# Patient Record
Sex: Male | Born: 1955 | Race: Black or African American | Hispanic: No | Marital: Single | Smoking: Current every day smoker
Health system: Southern US, Community
[De-identification: ages and names within clinical notes are randomized; demographics above are authoritative.]

## PROBLEM LIST (undated history)

## (undated) DIAGNOSIS — I10 Essential (primary) hypertension: Secondary | ICD-10-CM

## (undated) DIAGNOSIS — E119 Type 2 diabetes mellitus without complications: Secondary | ICD-10-CM

---

## 2009-05-27 ENCOUNTER — Emergency Department (HOSPITAL_COMMUNITY): Admission: EM | Admit: 2009-05-27 | Discharge: 2009-05-27 | Payer: Self-pay | Admitting: Emergency Medicine

## 2015-07-09 ENCOUNTER — Emergency Department (HOSPITAL_COMMUNITY)
Admission: EM | Admit: 2015-07-09 | Discharge: 2015-07-09 | Disposition: A | Discharge status: Home / Self Care | Payer: Self-pay | Attending: Emergency Medicine | Admitting: Emergency Medicine

## 2015-07-09 ENCOUNTER — Encounter (HOSPITAL_COMMUNITY): Payer: Self-pay | Admitting: Cardiology

## 2015-07-09 DIAGNOSIS — R000 Tachycardia, unspecified: Secondary | ICD-10-CM | POA: Insufficient documentation

## 2015-07-09 DIAGNOSIS — I10 Essential (primary) hypertension: Secondary | ICD-10-CM | POA: Insufficient documentation

## 2015-07-09 DIAGNOSIS — Z720 Tobacco use: Secondary | ICD-10-CM | POA: Insufficient documentation

## 2015-07-09 DIAGNOSIS — F1920 Other psychoactive substance dependence, uncomplicated: Secondary | ICD-10-CM

## 2015-07-09 DIAGNOSIS — F1410 Cocaine abuse, uncomplicated: Secondary | ICD-10-CM | POA: Insufficient documentation

## 2015-07-09 HISTORY — DX: Essential (primary) hypertension: I10

## 2015-07-09 MED ORDER — CLONIDINE HCL 0.1 MG PO TABS
0.1000 mg | ORAL_TABLET | Freq: Once | ORAL | Status: AC
  Administered 2015-07-09: 0.1 mg via ORAL
  Filled 2015-07-09: qty 1

## 2015-07-09 MED ORDER — BENAZEPRIL HCL 10 MG PO TABS
40.0000 mg | ORAL_TABLET | Freq: Once | ORAL | Status: AC
  Administered 2015-07-09: 40 mg via ORAL
  Filled 2015-07-09: qty 4

## 2015-07-09 MED ORDER — BENAZEPRIL HCL 40 MG PO TABS: 40.0000 mg | ORAL_TABLET | Freq: Every day | ORAL | Status: DC

## 2015-07-09 NOTE — Discharge Instructions (Signed)
If you were given medicines take as directed.  If you are on coumadin or contraceptives realize their levels and effectiveness is altered by many different medicines.  If you have any reaction (rash, tongues swelling, other) to the medicines stop taking and see a physician.    If your blood pressure was elevated in the ER make sure you follow up for management with a primary doctor or return for chest pain, shortness of breath or stroke symptoms.  Please follow up as directed and return to the ER or see a physician for new or worsening symptoms.  Thank you. Filed Vitals:   07/09/15 0937  BP: 197/122  Pulse: 107  Temp: 98 F (36.7 C)  TempSrc: Oral  Resp: 18  Height: 5\' 11"  (1.803 m)  Weight: 180 lb (81.647 kg)  SpO2: 96%    Emergency Department Resource Guide 1) Find a Doctor and Pay Out of Pocket Although you won't have to find out who is covered by your insurance plan, it is a good idea to ask around and get recommendations. You will then need to call the office and see if the doctor you have chosen will accept you as a new patient and what types of options they offer for patients who are self-pay. Some doctors offer discounts or will set up payment plans for their patients who do not have insurance, but you will need to ask so you aren't surprised when you get to your appointment.  2) Contact Your Local Health Department Not all health departments have doctors that can see patients for sick visits, but many do, so it is worth a call to see if yours does. If you don't know where your local health department is, you can check in your phone book. The CDC also has a tool to help you locate your state's health department, and many state websites also have listings of all of their local health departments.  3) Find a Walk-in Clinic If your illness is not likely to be very severe or complicated, you may want to try a walk in clinic. These are popping up all over the country in pharmacies,  drugstores, and shopping centers. They're usually staffed by nurse practitioners or physician assistants that have been trained to treat common illnesses and complaints. They're usually fairly quick and inexpensive. However, if you have serious medical issues or chronic medical problems, these are probably not your best option.  No Primary Care Doctor: - Call Health Connect at  [phone] - they can help you locate a primary care doctor that  accepts your insurance, provides certain services, etc. - Physician Referral Service- [phone]  Chronic Pain Problems: Organization         Address  Phone   Notes  [facility]  [phone] Patients need to be referred by their primary care doctor.   Medication Assistance: Organization         Address  Phone   Notes  [facility] Medication [facility] [address] [place] [place], [place] [address] [phone] --Must be a resident of [facility] -- Must have NO insurance coverage whatsoever (no Medicaid/ Medicare, etc.) -- The pt. MUST have a primary care doctor that directs their care regularly and follows them in the community   Med[facility]ist  [phone]   [employer]  [phone]    Agencies that provide inexpensive medical care: Retail buyer  Notes  [facility]  [phone]   [facility]    [phone]   [facility] [address] [place], [place] [address] [phone]   [facility] [address] [place] [address], [place] [phone]   Planned Parenthood    [phone]   [facility]    [phone]   [facility]  [address], [place] Phone:  [phone], Fax:  [phone] Hours of Operation:  9 am - 6 pm, M-F.  Also accepts Medicaid/Medicare and self-pay.  [facility]  [address], [place] Phone:  [phone], Fax: [phone]. Hours of Operation:  8:30 am - 5:30 pm, M-F.  Also accepts Medicaid and self-pay.  [facility] [place] [address] [place] Phone: [phone]   [facility] [address] Natasha Bence [place], [place] [phone], Ext. [phone] Mondays & Thursdays: 7-9 AM.  First 15 patients are seen on a first come, first serve basis.    Medicaid-accepting [facility] Providers:  Organization         Address  Phone   Notes  [facility] [address], [place] [phone] Also accepts self-pay patients.  [facility] [address] Laurell Josephs [place], [place]  [phone]   [facility] [address], [place] [phone]   [facility] [address] [place] [phone]   Renaye Rakers [address], [place]   [phone] Only accepts [place] patients after they have their name applied to their card.   Self-Pay (no insurance) in [facility]:  Organization         Address  Phone   Notes  Sickle Cell Patients, [facility] [address] [place], [place] [phone]   [facility] [address] [place], [place] [phone]   [facility] [place]  [address],  [phone]   [facility]/Dr. Osei-Bonsu  [address] [place] or [address], [place] [phone] Phone number for both [place] and [place] locations is the same.  [facility] and [facility] [address], [place] [phone]   [facility] [address] [place] or [address] [phone]   [facility] [address] [place] [phone], phone; [phone], fax Sees patients 1st and 3rd Saturday of every month.  Must not qualify for public  or private insurance (i.e. Medicaid, Medicare, [place] Health Choice, Veterans' Benefits)  Household income should be no more than 200% of the poverty level The clinic cannot treat you if you are pregnant or think you are pregnant  Sexually transmitted diseases are not treated at the clinic.    Dental Care: Organization         Address  Phone  Notes  [facility] Department of [facility] [address] [place], [place] [phone] Accepts children up to age 33 who are enrolled in [place] or [place] Health Choice; pregnant women with a Medicaid card; and children who have applied for Medicaid or  Health Choice, but were declined, whose parents can pay a reduced fee at time of service.  [facility]  Department of [facility]  [address], [place] [phone] Accepts children up to age 93 who are enrolled in [place] or [place] Health Choice; pregnant women with a Medicaid card; and children who have applied for Medicaid or [place] Health Choice, but were declined, whose parents can pay a reduced fee at time of service.  [facility]  [address] [place], [place] [phone] Patients are seen by appointment only. Walk-ins are not accepted. [facility] will see patients 53 years of age and older. Monday - Tuesday (8am-5pm) Most Wednesdays (8:30-5pm) $30 per visit, cash only  [facility]  [address], [facility] [phone] Patients are seen by appointment only. Walk-ins are not accepted. [facility] will see patients 75 years of age and older. One Wednesday Evening (Monthly: Volunteer Based).  $30 per visit, cash only  [employer]  [phone] for adults; Children under age 12, call Graduate Pediatric Dentistry at [phone]. Children aged 59-14, please call [phone] to request a pediatric application.  Dental services are provided in all areas of  dental care including fillings, crowns and bridges, complete and partial dentures, implants, gum treatment, root canals, and extractions. Preventive care is also provided. Treatment is provided to both adults and children. Patients are selected via a lottery and there is often a waiting list.   [facility] [address] [place]  [phone] [email or web address]   [facility] [address] [place], [place] [phone], Ext. [phone] Second and Fourth Thursday of each month, opens at 6:30 AM; Clinic ends at 9 AM.  Patients are seen on a first-come first-served basis, and a limited number are seen during each clinic.   [facility]  [address] [place], [place] [phone]   Eligibility Requirements You must have lived in [place], [place], or [place] counties for at least the last three months.   You cannot be eligible for state or federal sponsored [place], including [employer], [place], or [employer].   You generally cannot be eligible for healthcare insurance through your employer.    How to apply: Eligibility screenings are held every Tuesday and Wednesday afternoon from 1:00 pm until 4:00 pm. You do not need an appointment for the interview!  [facility] [address] [place], [place] [phone]   [facility] Health Department  [phone]   [facility] Health Department  [phone]   [facility] Health Department  [phone]    Behavioral Health Resources in the Community: Intensive Outpatient Programs Organization         Address  Phone  Notes  [facility] [address], [place], [place] [phone]   [facility] [address] [place], [place] [phone]   [facility] [address], [place], [place]  [phone]   [facility] [address],  [place], [place] [phone] or  [phone]   Substance Abuse Resources Organization         Address  Phone  Notes  [facility]  [phone]   [facility]  [phone]   [facility]  [phone]   Floydene Flock  [phone]   Residential & Outpatient Substance Abuse Program  [phone]   Psychological Services Organization         Address  Phone  Notes  [facility]  [phone]- [phone]   [facility]  [phone]- [phone]   [facility] [address], [place] [phone] or [phone]    Mobile Crisis Teams Organization         Address  Phone  Notes  [facility], Mobile Crisis Care Unit  [phone]   [facility]  [address] [place], [place] [phone]   Doristine LocksSharon DeEsch [address] [place] [phone]    Self-Help/Support Groups Organization         Address  Phone             Notes  [facility] [place] - variety of support groups  [phone] Call for more information  Narcotics Anonymous (NA), [facility] [address], [place]  2 meetings at this location   Statisticianesidential Treatment Programs Organization         Address  Phone  Notes  [facility] [address],    [place]  [phone]   [facility]  [address], [place] [phone], [place], [place] [phone]   [facility] [address], [place] [phone] Admissions: 8am-3pm M-F  [facility] [address]    [place], [place] [phone]   [facility] [address], [place], [place] [phone]   [facility] [address]  [place], [place] [phone]   [facility] - Intensive Outpatient [address], [place], [place] [phone]   [facility] ([facility] [address]  [place], [place] [phone] or [phone]   [facility]  [facility] Services ([facility]) [address] [place], [place] [phone] Accepts Medicaid  [facility] [address]  [place] [phone] Substance Abuse/Addiction Treatment   [facility] Resources Organization         Address  Phone  Notes  [facility]  [phone]   Angie FavaJulie Brannon, PhD [address] [place], [place]   [phone](336) [phone] or [phone](336) [phone]   [facility]   [address] [place] [address] [place], [place] [phone](336) [phone]   [facility] [address], [place], [place] [phone] Insurance/Medicaid/sponsorship through [facility]  [facility] Families [address]                                    [place], [place] [phone]) [phone] Therapy/tele-psych/case  [facility] [address]   [place], [place] [phone](336) [phone]    Dr. Lolly MustacheArfeen  [phone]) [phone]   [facility]  [employer] [facility] 1) [address],  2) [address], [place] 3)  [address], [place] [phone](336) [phone](336) [phone]  [phone](336) [phone]   [facility] Child Abuse Hotline [phone](336) [phone] or [phone](336) [phone] (After Hours)

## 2015-07-09 NOTE — ED Provider Notes (Signed)
CSN: [id number]     Arrival date & time 07/09/15  0922 History  This chart was scribed for Eddie Ohara, MD by Ronney Lion, ED Scribe. This patient was seen in room APA15/APA15 and the patient's care was started at 9:39 AM.    Chief Complaint  Patient presents with  . Drug Problem   Patient is a 59 y.o. male presenting with drug problem. The history is provided by the patient. No language interpreter was used.  Drug Problem This is a new problem. The current episode started more than 1 week ago. The problem occurs constantly. The problem has been gradually worsening. Pertinent negatives include no chest pain, no abdominal pain, no headaches and no shortness of breath. Nothing aggravates the symptoms. Nothing relieves the symptoms. He has tried nothing for the symptoms.   HPI Comments: Eddie Castro is a 59 y.o. male with a history of hypertension, who presents to the Emergency Department for help with alcohol and cocaine use. Patient states he uses cocaine 1-2 times a week and drinks liquor 3-4 times a week. He reports he started 1.5 years ago because he "started hanging out with the wrong crowd." Patient adds he had a divorce 3 years ago. He states he last drank alcohol last night, and endorses feeling "a little shaky" this morning. He denies ever seeking treatment for his drug problem. He denies vomiting, abdominal pain, or headaches.   Patient also requests a refill of benazepril 40 mg.  Past Medical History  Diagnosis Date  . Hypertension    History reviewed. No pertinent past surgical history. History reviewed. No pertinent family history. Social History  Substance Use Topics  . Smoking status: Current Every Day Smoker  . Smokeless tobacco: None  . Alcohol Use: Yes     Comment: last night.  drinks when he is off work.     Review of Systems  Respiratory: Negative for shortness of breath.   Cardiovascular: Negative for chest pain.  Gastrointestinal: Negative for vomiting and abdominal  pain.  Neurological: Negative for headaches.  All other systems reviewed and are negative.   Allergies  Review of patient's allergies indicates no known allergies.  Home Medications   Prior to Admission medications   Not on File   BP 197/122 mmHg  Pulse 107  Temp(Src) 98 F (36.7 C) (Oral)  Resp 18  Ht  (1.803 m)  Wt 180 lb (81.647 kg)  BMI 25.12 kg/m2  SpO2 96% Physical Exam  Constitutional: He is oriented to person, place, and time. He appears well-developed and well-nourished. No distress.  HENT:  Head: Normocephalic and atraumatic.  Eyes: Conjunctivae and EOM are normal. Pupils are equal, round, and reactive to light.  No nystagmus.  Neck: Neck supple. No tracheal deviation present.  Cardiovascular: Tachycardia present.   Mildly tachycardic.  Pulmonary/Chest: Effort normal. No respiratory distress. He has no wheezes. He has no rales.  Abdominal: Soft. There is no tenderness.  Musculoskeletal: Normal range of motion.  Neurological: He is alert and oriented to person, place, and time.  Skin: Skin is warm and dry.  Psychiatric: He has a normal mood and affect. His behavior is normal.  Nursing note and vitals reviewed.   ED Course  Procedures (including critical care time)  DIAGNOSTIC STUDIES: Oxygen Saturation is 96% on RA, normal by my interpretation.    COORDINATION OF CARE: 9:42 AM - Patient medically cleared by me. Discussed treatment plan with pt at bedside which includes referral to drug and alcohol treatment  center. Will refill benazepril 40mg  and instructed pt to f/u with PCP this week for further management of his hypertension medication. Pt verbalized understanding and agreed to plan.   MDM   Final diagnoses:  Essential hypertension  Drug abuse and dependence  Cocaine abuse   Patient presents for drug abuse/cocaine abuse. Last used yesterday. No signs of significant withdrawal. Patient has no other symptoms currently and medically clear second  drug abuse and high blood pressure for which she has not been compliant with medications and asked for prescription. Patient has no signs or symptoms of endorgan damage. Blood pressure meds orally given in the ER and strict follow-up this week discussed.  Results and differential diagnosis were discussed with the patient/parent/guardian. Xrays were independently reviewed by myself.  Close follow up outpatient was discussed, comfortable with the plan.   Medications  benazepril (LOTENSIN) tablet 40 mg (40 mg Oral Given 07/09/15 1027)  cloNIDine (CATAPRES) tablet 0.1 mg (0.1 mg Oral Given 07/09/15 1027)    Filed Vitals:   07/09/15 0937  BP: 197/122  Pulse: 107  Temp: 98 F (36.7 C)  TempSrc: Oral  Resp: 18  Height: 5\' 11"  (1.803 m)  Weight: 180 lb (81.647 kg)  SpO2: 96%    Final diagnoses:  Essential hypertension  Drug abuse and dependence  Cocaine abuse      Eddie Ohara, MD 07/09/15 1058

## 2015-07-09 NOTE — ED Notes (Signed)
Wants help with alcohol,  And drug problem

## 2015-07-09 NOTE — ED Notes (Signed)
Patient with no complaints at this time. Respirations even and unlabored. Skin warm/dry. Discharge instructions reviewed with patient at this time. Patient given opportunity to voice concerns/ask questions. Patient discharged at this time and left Emergency Department with steady gait.   

## 2017-02-05 ENCOUNTER — Encounter (HOSPITAL_COMMUNITY): Payer: Self-pay

## 2017-02-05 ENCOUNTER — Emergency Department (HOSPITAL_COMMUNITY)
Admission: EM | Admit: 2017-02-05 | Discharge: 2017-02-05 | Disposition: A | Discharge status: Home / Self Care | Payer: Self-pay | Attending: Emergency Medicine | Admitting: Emergency Medicine

## 2017-02-05 DIAGNOSIS — I10 Essential (primary) hypertension: Secondary | ICD-10-CM | POA: Insufficient documentation

## 2017-02-05 DIAGNOSIS — E119 Type 2 diabetes mellitus without complications: Secondary | ICD-10-CM | POA: Insufficient documentation

## 2017-02-05 DIAGNOSIS — F17210 Nicotine dependence, cigarettes, uncomplicated: Secondary | ICD-10-CM | POA: Insufficient documentation

## 2017-02-05 DIAGNOSIS — K047 Periapical abscess without sinus: Secondary | ICD-10-CM | POA: Insufficient documentation

## 2017-02-05 HISTORY — DX: Type 2 diabetes mellitus without complications: E11.9

## 2017-02-05 LAB — I-STAT CHEM 8, ED
BUN: 10 mg/dL (ref 6–20)
CALCIUM ION: 1.16 mmol/L (ref 1.15–1.40)
CHLORIDE: 104 mmol/L (ref 101–111)
Creatinine, Ser: 0.9 mg/dL (ref 0.61–1.24)
Glucose, Bld: 122 mg/dL — ABNORMAL HIGH (ref 65–99)
HEMATOCRIT: 50 % (ref 39.0–52.0)
Hemoglobin: 17 g/dL (ref 13.0–17.0)
POTASSIUM: 3.7 mmol/L (ref 3.5–5.1)
SODIUM: 141 mmol/L (ref 135–145)
TCO2: 27 mmol/L (ref 0–100)

## 2017-02-05 MED ORDER — ACETAMINOPHEN 325 MG PO TABS
650.0000 mg | ORAL_TABLET | Freq: Once | ORAL | Status: AC
  Administered 2017-02-05: 650 mg via ORAL
  Filled 2017-02-05: qty 2

## 2017-02-05 MED ORDER — BENAZEPRIL HCL 40 MG PO TABS
40.0000 mg | ORAL_TABLET | Freq: Once | ORAL | Status: AC
  Administered 2017-02-05: 40 mg via ORAL
  Filled 2017-02-05: qty 1

## 2017-02-05 MED ORDER — HYDROCODONE-ACETAMINOPHEN 5-325 MG PO TABS: 1.0000 | ORAL_TABLET | ORAL | 0 refills | Status: DC | PRN

## 2017-02-05 MED ORDER — AMOXICILLIN 250 MG PO CAPS
500.0000 mg | ORAL_CAPSULE | Freq: Once | ORAL | Status: AC
Start: 2017-02-05 — End: 2017-02-05
  Administered 2017-02-05: 500 mg via ORAL
  Filled 2017-02-05: qty 2

## 2017-02-05 MED ORDER — AMOXICILLIN 500 MG PO CAPS: 500.0000 mg | ORAL_CAPSULE | Freq: Three times a day (TID) | ORAL | 0 refills | Status: AC

## 2017-02-05 MED ORDER — BENAZEPRIL HCL 40 MG PO TABS: 40.0000 mg | ORAL_TABLET | Freq: Every day | ORAL | 0 refills | Status: DC

## 2017-02-05 NOTE — Discharge Instructions (Signed)
Complete your entire course of antibiotics as prescribed.  You  may use the hydrocodone for pain relief but do not drive within 4 hours of taking as this will make you drowsy.  Avoid applying heat or ice to this abscess area which can worsen your symptoms.  You may use warm salt water swish and spit treatment or half peroxide and water swish and spit after meals to keep this area clean as discussed.  Call the dentist listed above for further management of your symptoms.  Call your doctor for an office visit to recheck your blood pressure and for further prescriptions so you do not run out of this medicine.

## 2017-02-05 NOTE — ED Notes (Addendum)
Called AC for BP medication

## 2017-02-05 NOTE — ED Triage Notes (Signed)
Dental pain today. Right upper molars. Denies trauma

## 2017-02-08 NOTE — ED Provider Notes (Signed)
[facility]-EMERGENCY DEPT Provider Note   CSN: [id number] Arrival date & time: 02/05/17  1818     History   Chief Complaint Chief Complaint  Patient presents with  . Dental Pain    Sx  x 3 hours _right upper molars    HPI Eddie Castro is a 61 y.o. male presenting with rather sudden onset of right upper dental pain today.  He has a history of chronic dental decay with episodes of dental abscesses.  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  He reports pain and swelling along his upper right dentition which happened suddenly while eating lunch today. He denies fevers, chills or other complaint.  Discussed elevated bp, pt states he has been out of his benazapril for about a month.  He denies headache, vision changes, cp, sob and peripheral edema. He does have plenty of his metformin which he takes for DM.  The history is provided by the patient.    Past Medical History:  Diagnosis Date  . Diabetes mellitus without complication (HCC)   . Hypertension     There are no active problems to display for this patient.   History reviewed. No pertinent surgical history.     Home Medications    Prior to Admission medications   Medication Sig Start Date End Date Taking? Authorizing Provider  amoxicillin (AMOXIL) 500 MG capsule Take 1 capsule (500 mg total) by mouth 3 (three) times daily. 02/05/17 02/15/17  Burgess AmorJulie Chaya Dehaan, PA-C  benazepril (LOTENSIN) 40 MG tablet Take 1 tablet (40 mg total) by mouth daily. 02/05/17   Burgess AmorJulie Lashana Spang, PA-C  HYDROcodone-acetaminophen (NORCO/VICODIN) 5-325 MG tablet Take 1 tablet by mouth every 4 (four) hours as needed. 02/05/17   Burgess AmorJulie Yocelyn Brocious, PA-C    Family History History reviewed. No pertinent family history.  Social History Social History  Substance Use Topics  . Smoking status: Current Every Day Smoker    Packs/day: 0.50    Types:  Cigarettes  . Smokeless tobacco: Never Used  . Alcohol use 1.2 oz/week    2 Shots of liquor per week     Comment: last night.  drinks when he is off work.      Allergies   Patient has no known allergies.   Review of Systems Review of Systems  Constitutional: Negative for fever.  HENT: Positive for dental problem. Negative for facial swelling and sore throat.   Eyes: Negative for visual disturbance.  Respiratory: Negative for shortness of breath.   Cardiovascular: Negative for chest pain and leg swelling.  Musculoskeletal: Negative for neck pain and neck stiffness.  Neurological: Negative for headaches.     Physical Exam Updated Vital Signs BP (!) 195/144 (BP Location: Left Arm)   Pulse 92   Temp 98 F (36.7 C) (Oral)   Resp 16   Ht 5\' 11"  (1.803 m)   Wt 83.9 kg   SpO2 98%   BMI 25.80 kg/m   Physical Exam  Constitutional: He is oriented to person, place, and time. He appears well-developed and well-nourished. No distress.  HENT:  Head: Normocephalic and atraumatic.  Right Ear: Tympanic membrane and external ear normal.  Left Ear: Tympanic membrane and external ear normal.  Mouth/Throat: Oropharynx is clear and moist and mucous membranes are normal. No oral lesions. No trismus in the jaw. No dental abscesses.  Poor dentition with several dental extractions.  ttp along right upper lateral gingiva. Gingival recession and cavities present.  Eyes: Conjunctivae are normal.  Neck: Normal range of motion. Neck supple.  Cardiovascular: Normal rate and normal heart sounds.   Pulmonary/Chest: Effort normal.  Abdominal: He exhibits no distension.  Musculoskeletal: Normal range of motion.  Lymphadenopathy:    He has no cervical adenopathy.  Neurological: He is alert and oriented to person, place, and time.  Skin: Skin is warm and dry. No erythema.  Psychiatric: He has a normal mood and affect.     ED Treatments / Results  Labs (all labs ordered are listed, but only  abnormal results are displayed) Labs Reviewed  I-STAT CHEM 8, ED - Abnormal; Notable for the following:       Result Value   Glucose, Bld 122 (*)    All other components within normal limits    EKG  EKG Interpretation None       Radiology No results found.  Procedures Procedures (including critical care time)  Medications Ordered in ED Medications  acetaminophen (TYLENOL) tablet 650 mg (650 mg Oral Given 02/05/17 2045)  amoxicillin (AMOXIL) capsule 500 mg (500 mg Oral Given 02/05/17 2045)  benazepril (LOTENSIN) tablet 40 mg (40 mg Oral Given 02/05/17 2238)     Initial Impression / Assessment and Plan / ED Course  I have reviewed the triage vital signs and the nursing notes.  Pertinent labs & imaging results that were available during my care of the patient were reviewed by me and considered in my medical decision making (see chart for details).     Pt with dental chronic dental pain/gingivitis and probable early abscess. He was started on amoxil, hydrocodone given.  Extremely elevated bp with no evidence of end organ damage.  He was given refill of his benazapril with first dose given here. Advised he needs f/u with his pcp for bp check and further management of dental issues.   Final Clinical Impressions(s) / ED Diagnoses   Final diagnoses:  Dental abscess  Essential hypertension    New Prescriptions Discharge Medication List as of 02/05/2017  9:18 PM    START taking these medications   Details  amoxicillin (AMOXIL) 500 MG capsule Take 1 capsule (500 mg total) by mouth 3 (three) times daily., Starting Tue 02/05/2017, Until Fri 02/15/2017, Print    HYDROcodone-acetaminophen (NORCO/VICODIN) 5-325 MG tablet Take 1 tablet by mouth every 4 (four) hours as needed., Starting Tue 02/05/2017, Print         Burgess Amor, PA-C 02/08/17 1254    Donnetta Hutching, MD 02/09/17 1649

## 2017-12-31 ENCOUNTER — Emergency Department (HOSPITAL_COMMUNITY)
Admission: EM | Admit: 2017-12-31 | Discharge: 2017-12-31 | Disposition: A | Discharge status: Home / Self Care | Payer: Self-pay | Attending: Emergency Medicine | Admitting: Emergency Medicine

## 2017-12-31 ENCOUNTER — Encounter (HOSPITAL_COMMUNITY): Payer: Self-pay | Admitting: Emergency Medicine

## 2017-12-31 ENCOUNTER — Other Ambulatory Visit: Payer: Self-pay

## 2017-12-31 DIAGNOSIS — I10 Essential (primary) hypertension: Secondary | ICD-10-CM | POA: Insufficient documentation

## 2017-12-31 DIAGNOSIS — K047 Periapical abscess without sinus: Secondary | ICD-10-CM | POA: Insufficient documentation

## 2017-12-31 DIAGNOSIS — E119 Type 2 diabetes mellitus without complications: Secondary | ICD-10-CM | POA: Insufficient documentation

## 2017-12-31 DIAGNOSIS — J029 Acute pharyngitis, unspecified: Secondary | ICD-10-CM | POA: Insufficient documentation

## 2017-12-31 DIAGNOSIS — F17210 Nicotine dependence, cigarettes, uncomplicated: Secondary | ICD-10-CM | POA: Insufficient documentation

## 2017-12-31 LAB — RAPID STREP SCREEN (MED CTR MEBANE ONLY): Streptococcus, Group A Screen (Direct): NEGATIVE

## 2017-12-31 MED ORDER — AMOXICILLIN 500 MG PO CAPS: 500.0000 mg | ORAL_CAPSULE | Freq: Three times a day (TID) | ORAL | 0 refills | Status: DC

## 2017-12-31 MED ORDER — BENAZEPRIL HCL 40 MG PO TABS: 40.0000 mg | ORAL_TABLET | Freq: Every day | ORAL | 0 refills | Status: AC

## 2017-12-31 MED ORDER — MAGIC MOUTHWASH W/LIDOCAINE: 5.0000 mL | Freq: Three times a day (TID) | ORAL | 0 refills | Status: DC | PRN

## 2017-12-31 MED ORDER — AMOXICILLIN 250 MG PO CAPS
1000.0000 mg | ORAL_CAPSULE | Freq: Once | ORAL | Status: AC
  Administered 2017-12-31: 1000 mg via ORAL
  Filled 2017-12-31: qty 4

## 2017-12-31 NOTE — Discharge Instructions (Signed)
Tylenol every 4 hours if needed for fever.  Follow-up with your dentist at the [facility], or you may call 1 of the dentist on the list provided.  Return to the ER for any worsening symptoms.

## 2017-12-31 NOTE — ED Triage Notes (Signed)
Patient reports sore throat that started 2 days ago. Patient states he also feels nauseated, no emesis.

## 2018-01-01 NOTE — ED Provider Notes (Signed)
[facility] EMERGENCY DEPARTMENT Provider Note   CSN: [id number] Arrival date & time: 12/31/17  1343     History   Chief Complaint Chief Complaint  Patient presents with  . Sore Throat    HPI Eddie Castro is a 62 y.o. male.  HPI   Eddie Castro is a 62 y.o. male who presents to the Emergency Department complaining of sore throat and dental pain for 2-3 days.  Reports pain associated with swallowing and noticed some drainage from the gums surrounding a right upper tooth.  Drainage is foul tasting.  He has not tried any therapies or medications for symptom relief.  He denies fever, difficulty swallowing, neck pain, facial pain or swelling.  Also states that he is running low on his BP medication.  Unable to see his PCP at the [facility] for couple weeks.     Past Medical History:  Diagnosis Date  . Diabetes mellitus without complication (HCC)    Borderline  . Hypertension     There are no active problems to display for this patient.   History reviewed. No pertinent surgical history.     Home Medications    Prior to Admission medications   Medication Sig Start Date End Date Taking? Authorizing Provider  amoxicillin (AMOXIL) 500 MG capsule Take 1 capsule (500 mg total) by mouth 3 (three) times daily. 12/31/17   Renie Stelmach, PA-C  benazepril (LOTENSIN) 40 MG tablet Take 1 tablet (40 mg total) by mouth daily. 12/31/17   Kay Shippy, PA-C  HYDROcodone-acetaminophen (NORCO/VICODIN) 5-325 MG tablet Take 1 tablet by mouth every 4 (four) hours as needed. 02/05/17   Burgess Amor, PA-C  magic mouthwash w/lidocaine SOLN Take 5 mLs by mouth 3 (three) times daily as needed for mouth pain. Swish and spit, do not swallow 12/31/17   Pauline Aus, PA-C    Family History History reviewed. No pertinent family history.  Social History Social History   Tobacco Use  . Smoking status: Current Every Day Smoker    Packs/day: 0.50    Types: Cigarettes  . Smokeless tobacco: Never Used    Substance Use Topics  . Alcohol use: Yes    Alcohol/week: 1.2 oz    Types: 2 Shots of liquor per week    Comment: last night.  drinks when he is off work.   . Drug use: No    Comment: denies     Allergies   Patient has no known allergies.   Review of Systems Review of Systems  Constitutional: Negative for appetite change and fever.  HENT: Positive for dental problem and sore throat. Negative for congestion, facial swelling, trouble swallowing and voice change.   Eyes: Negative for pain and visual disturbance.  Respiratory: Negative for cough, chest tightness and shortness of breath.   Cardiovascular: Negative for chest pain and leg swelling.  Musculoskeletal: Negative for neck pain and neck stiffness.  Neurological: Negative for dizziness, facial asymmetry, speech difficulty, weakness, numbness and headaches.  Hematological: Negative for adenopathy.  All other systems reviewed and are negative.    Physical Exam Updated Vital Signs BP (!) 171/118 (BP Location: Left Arm)   Pulse 64   Temp 98.6 F (37 C) (Oral)   Resp 18   Ht 5\' 11"  (1.803 m)   Wt 83 kg (183 lb)   SpO2 97%   BMI 25.52 kg/m   Physical Exam  Constitutional: He is oriented to person, place, and time. He appears well-developed and well-nourished. No distress.  HENT:  Head:  Normocephalic and atraumatic.  Right Ear: Tympanic membrane and ear canal normal.  Left Ear: Tympanic membrane and ear canal normal.  Mouth/Throat: Uvula is midline and mucous membranes are normal. No trismus in the jaw. Dental caries present. No dental abscesses or uvula swelling. Posterior oropharyngeal erythema present. No oropharyngeal exudate, posterior oropharyngeal edema or tonsillar abscesses. No tonsillar exudate.  ttp and dental caries of the right upper second molar with erythema and edema of the surrounding gingiva.  No facial swelling, obvious dental abscess, trismus, or sublingual abnml.    Neck: Normal range of motion.  Neck supple.  Cardiovascular: Normal rate, regular rhythm and normal heart sounds.  No murmur heard. Pulmonary/Chest: Effort normal and breath sounds normal.  Abdominal: There is no splenomegaly. There is no tenderness.  Musculoskeletal: Normal range of motion.  Lymphadenopathy:    He has no cervical adenopathy.  Neurological: He is alert and oriented to person, place, and time. He exhibits normal muscle tone. Coordination normal.  Skin: Skin is warm and dry.  Psychiatric: He has a normal mood and affect.  Nursing note and vitals reviewed.    ED Treatments / Results  Labs (all labs ordered are listed, but only abnormal results are displayed) Labs Reviewed  RAPID STREP SCREEN (NOT AT [facility])  CULTURE, GROUP A STREP [facility])    EKG  EKG Interpretation None       Radiology No results found.  Procedures Procedures (including critical care time)  Medications Ordered in ED Medications  amoxicillin (AMOXIL) capsule 1,000 mg (1,000 mg Oral Given 12/31/17 1701)     Initial Impression / Assessment and Plan / ED Course  I have reviewed the triage vital signs and the nursing notes.  Pertinent labs & imaging results that were available during my care of the patient were reviewed by me and considered in my medical decision making (see chart for details).     Pt is well appearing.  Airway is patent.  No concerning sx's for Ludwig's angina or PTA.  Strep screen neg.  Pt is hypertensive, but has not been taking his anti-hypertensive medication daily due to running low on his prescription. Asymptomatic.   I will provide refill until he can f/u with PCP at the [facility].    Appears safe for d/c home.  Dental referral info provided.  Return precautions discussed.    Final Clinical Impressions(s) / ED Diagnoses   Final diagnoses:  Sore throat  Dental abscess    ED Discharge Orders        Ordered    amoxicillin (AMOXIL) 500 MG capsule  3 times daily     12/31/17 1654    magic mouthwash  w/lidocaine SOLN  3 times daily PRN     12/31/17 1654    benazepril (LOTENSIN) 40 MG tablet  Daily     12/31/17 1654       Pauline Ausriplett, Porschea Borys, PA-C 01/01/18 1506    Donnetta Hutchingook, Brian, MD 01/02/18 1743

## 2018-01-03 LAB — CULTURE, GROUP A STREP (THRC)

## 2020-06-06 ENCOUNTER — Emergency Department (HOSPITAL_COMMUNITY)
Admission: EM | Admit: 2020-06-06 | Discharge: 2020-06-06 | Disposition: A | Discharge status: Home / Self Care | Payer: No Typology Code available for payment source | Attending: Emergency Medicine | Admitting: Emergency Medicine

## 2020-06-06 ENCOUNTER — Encounter (HOSPITAL_COMMUNITY): Payer: Self-pay | Admitting: *Deleted

## 2020-06-06 ENCOUNTER — Other Ambulatory Visit: Payer: Self-pay

## 2020-06-06 ENCOUNTER — Emergency Department (HOSPITAL_COMMUNITY): Payer: No Typology Code available for payment source

## 2020-06-06 DIAGNOSIS — Z79899 Other long term (current) drug therapy: Secondary | ICD-10-CM | POA: Diagnosis not present

## 2020-06-06 DIAGNOSIS — W108XXA Fall (on) (from) other stairs and steps, initial encounter: Secondary | ICD-10-CM | POA: Diagnosis not present

## 2020-06-06 DIAGNOSIS — S8991XA Unspecified injury of right lower leg, initial encounter: Secondary | ICD-10-CM | POA: Insufficient documentation

## 2020-06-06 DIAGNOSIS — Y939 Activity, unspecified: Secondary | ICD-10-CM | POA: Insufficient documentation

## 2020-06-06 DIAGNOSIS — E119 Type 2 diabetes mellitus without complications: Secondary | ICD-10-CM | POA: Diagnosis not present

## 2020-06-06 DIAGNOSIS — Y929 Unspecified place or not applicable: Secondary | ICD-10-CM | POA: Diagnosis not present

## 2020-06-06 DIAGNOSIS — F17210 Nicotine dependence, cigarettes, uncomplicated: Secondary | ICD-10-CM | POA: Diagnosis not present

## 2020-06-06 DIAGNOSIS — Y999 Unspecified external cause status: Secondary | ICD-10-CM | POA: Insufficient documentation

## 2020-06-06 DIAGNOSIS — I10 Essential (primary) hypertension: Secondary | ICD-10-CM | POA: Diagnosis not present

## 2020-06-06 MED ORDER — DICLOFENAC SODIUM 1 % EX GEL: 4.0000 g | Freq: Four times a day (QID) | CUTANEOUS | 0 refills | Status: AC | PRN

## 2020-06-06 MED ORDER — OXYCODONE-ACETAMINOPHEN 5-325 MG PO TABS
1.0000 | ORAL_TABLET | Freq: Once | ORAL | Status: AC
  Administered 2020-06-06: 1 via ORAL
  Filled 2020-06-06: qty 1

## 2020-06-06 NOTE — ED Provider Notes (Signed)
[facility] EMERGENCY DEPARTMENT Provider Note   CSN: [id number] Arrival date & time: 06/06/20  1300     History Chief Complaint  Patient presents with  . Knee Injury    Eddie Castro is a 64 y.o. male with a history of hypertension & diabetes mellitus who presents to the ED with complaints of R knee pain S/p fall last night. Patient states he slipped on a wet step and fell forward onto his R knee. He denies head injury or LOC. States he only injured the R knee, pain is constant, worse with movement, no alleviating factors. He has been unable to bear weight. Denies numbness, tingling, weakness, headache, neck pain, back pain, chest pain, or abdominal pain.   HPI     Past Medical History:  Diagnosis Date  . Diabetes mellitus without complication (HCC)    Borderline  . Hypertension     There are no problems to display for this patient.   History reviewed. No pertinent surgical history.     History reviewed. No pertinent family history.  Social History   Tobacco Use  . Smoking status: Current Every Day Smoker    Packs/day: 0.50    Types: Cigarettes  . Smokeless tobacco: Never Used  Vaping Use  . Vaping Use: Never used  Substance Use Topics  . Alcohol use: Yes    Alcohol/week: 2.0 standard drinks    Types: 2 Shots of liquor per week    Comment: last night.  drinks when he is off work.   . Drug use: No    Types: Cocaine, Marijuana    Comment: denies    Home Medications Prior to Admission medications   Medication Sig Start Date End Date Taking? Authorizing Provider  amoxicillin (AMOXIL) 500 MG capsule Take 1 capsule (500 mg total) by mouth 3 (three) times daily. 12/31/17   Triplett, Tammy, PA-C  benazepril (LOTENSIN) 40 MG tablet Take 1 tablet (40 mg total) by mouth daily. 12/31/17   Triplett, Tammy, PA-C  HYDROcodone-acetaminophen (NORCO/VICODIN) 5-325 MG tablet Take 1 tablet by mouth every 4 (four) hours as needed. 02/05/17   Burgess Amor, PA-C  magic mouthwash  w/lidocaine SOLN Take 5 mLs by mouth 3 (three) times daily as needed for mouth pain. Swish and spit, do not swallow 12/31/17   Triplett, Tammy, PA-C    Allergies    Patient has no known allergies.  Review of Systems   Review of Systems  Constitutional: Negative for chills and fever.  Eyes: Negative for visual disturbance.  Respiratory: Negative for cough and shortness of breath.   Cardiovascular: Negative for chest pain.  Gastrointestinal: Negative for abdominal pain, nausea and vomiting.  Musculoskeletal: Positive for arthralgias. Negative for back pain and neck pain.  Neurological: Negative for dizziness, seizures, syncope, weakness, numbness and headaches.  All other systems reviewed and are negative.   Physical Exam Updated Vital Signs BP (!) 185/116 (BP Location: Right Arm)   Pulse (!) 109   Temp 98.3 F (36.8 C)   Resp 20   Ht 5\' 11"  (1.803 m)   Wt 79.4 kg   SpO2 98%   BMI 24.41 kg/m   Physical Exam Vitals and nursing note reviewed.  Constitutional:      General: He is not in acute distress.    Appearance: He is well-developed. He is not ill-appearing or toxic-appearing.  HENT:     Head: Normocephalic and atraumatic.     Comments: No racoon eyes or battle sign.  Eyes:  General:        Right eye: No discharge.        Left eye: No discharge.     Extraocular Movements: Extraocular movements intact.     Conjunctiva/sclera: Conjunctivae normal.     Pupils: Pupils are equal, round, and reactive to light.  Cardiovascular:     Rate and Rhythm: Normal rate and regular rhythm.     Pulses:          Dorsalis pedis pulses are 2+ on the right side and 2+ on the left side.       Posterior tibial pulses are 2+ on the right side and 2+ on the left side.  Pulmonary:     Effort: Pulmonary effort is normal. No respiratory distress.     Breath sounds: Normal breath sounds. No wheezing, rhonchi or rales.  Chest:     Chest wall: No tenderness.  Abdominal:     General: There  is no distension.     Palpations: Abdomen is soft.     Tenderness: There is no abdominal tenderness. There is no guarding or rebound.  Musculoskeletal:     Cervical back: Normal range of motion and neck supple. No tenderness.     Comments: UEs: Intact ROM. No focal tenderness Back: No midline spinal tenderness.  LEs: patient has an abrasion just inferior to the R patella, no active bleeding, visible foreign bodies, ecchymosis, or surrounding erythema.  Patient has intact active range of motion throughout including the right knee.  Patient is tender to the right patella tendon just inferior to the patella extending to the proximal one third of the tibia.  Lower extremities are otherwise nontender.  Skin:    General: Skin is warm and dry.     Capillary Refill: Capillary refill takes less than 2 seconds.     Findings: No rash.  Neurological:     Mental Status: He is alert.     Comments: Alert. Clear speech. Sensation grossly intact to bilateral lower extremities. 5/5 strength with knee flexion/extension and ankle plantar/dorsiflexion bilaterally.   Psychiatric:        Mood and Affect: Mood normal.        Behavior: Behavior normal.     ED Results / Procedures / Treatments   Labs (all labs ordered are listed, but only abnormal results are displayed) Labs Reviewed - No data to display  EKG None  Radiology No results found.  Procedures Procedures (including critical care time)  Medications Ordered in ED Medications  oxyCODONE-acetaminophen (PERCOCET/ROXICET) 5-325 MG per tablet 1 tablet (has no administration in time range)    ED Course  I have reviewed the triage vital signs and the nursing notes.  Pertinent labs & imaging results that were available during my care of the patient were reviewed by me and considered in my medical decision making (see chart for details).    MDM Rules/Calculators/A&P                         Patient presents to the ED with complaints of right  knee pain status post injury last night.  Patient is nontoxic, resting comfortably, he is quite hypertensive, seems similar to his prior medical visits, low suspicion for hypertensive emergency, we discussed the need for close follow-up for recheck.  No signs of serious head, neck, back, or intra-abdominal/thoracic injury.  Seems to have isolated injury to the right knee, there is a small abrasion present, he states his last  tetanus was within the past 5 years.    Additional history obtained:  Additional history obtained from chart & nursing note review.   Imaging Studies ordered:  I ordered imaging studies which included right knee & tib/fib, I independently visualized and interpreted imaging which showed no acute fracture or dislocation.   X-rays without fx/dislocation. No signs of infection on exam. No edema of calf tenderness to raise concern for DVT. NVI distally. Knee immobilizer will be applied in the ED, patient has crutches he is utilizing, will prescribe diclofenac gel with orthopedics follow up. I discussed results, treatment plan, need for follow-up, and return precautions with the patient. Provided opportunity for questions, patient confirmed understanding and is in agreement with plan.   Portions of this note were generated with Scientist, clinical (histocompatibility and immunogenetics). Dictation errors may occur despite best attempts at proofreading.  Final Clinical Impression(s) / ED Diagnoses Final diagnoses:  Injury of right knee, initial encounter    Rx / DC Orders ED Discharge Orders         Ordered    diclofenac Sodium (VOLTAREN) 1 % GEL  4 times daily PRN     Discontinue  Reprint     06/06/20 1534           Modesta Sammons, Pleas Koch, PA-C 06/06/20 1536    Bethann Berkshire, MD 06/07/20 [phone]

## 2020-06-06 NOTE — ED Triage Notes (Signed)
Right knee pain, fell last night

## 2020-06-06 NOTE — Discharge Instructions (Signed)
Please read and follow all provided instructions.  You have been seen today for a right knee injury.  Tests performed today include: An x-ray of the affected areas - does NOT show any broken bones or dislocations.  Vital signs. See below for your results today.   Home care instructions: -- *PRICE in the first 24-48 hours after injury: Protect (with brace, splint, sling), if given by your provider Rest Ice- Do not apply ice pack directly to your skin, place towel or similar between your skin and ice/ice pack. Apply ice for 20 min, then remove for 40 min while awake Compression- Wear brace, elastic bandage, splint as directed by your provider Elevate affected extremity above the level of your heart when not walking around for the first 24-48 hours   Please wear your knee immobilizer at all times until you have followed up with orthopedics. Medications:  - Diclofenac gel-apply gel to your right knee to area of pain 4 times per day as needed.  This is a medication help with pain and swelling.  You make take Tylenol per over the counter dosing with these medications.   We have prescribed you new medication(s) today. Discuss the medications prescribed today with your pharmacist as they can have adverse effects and interactions with your other medicines including over the counter and prescribed medications. Seek medical evaluation if you start to experience new or abnormal symptoms after taking one of these medicines, seek care immediately if you start to experience difficulty breathing, feeling of your throat closing, facial swelling, or rash as these could be indications of a more serious allergic reaction   Follow-up instructions: Please follow-up with your primary care provider or the provided orthopedic physician (bone specialist) within 1 week. Return instructions:  Please return if your digits or extremity are numb or tingling, appear gray or blue, or you have severe pain (also elevate the  extremity and loosen splint or wrap if you were given one) Please return if you have redness or fevers.  Please return to the Emergency Department if you experience worsening symptoms.  Please return if you have any other emergent concerns. Additional Information:  Your vital signs today were: BP (!) 177/125   Pulse 90   Temp 98.3 F (36.8 C)   Resp 18   Ht 5\' 11"  (1.803 m)   Wt 79.4 kg   SpO2 96%   BMI 24.41 kg/m  If your blood pressure (BP) was elevated above 135/85 this visit, please have this repeated by your doctor within one month. ---------------

## 2020-06-08 ENCOUNTER — Telehealth: Payer: Self-pay | Admitting: Orthopedic Surgery

## 2020-06-08 NOTE — Telephone Encounter (Signed)
We were not on call that day.  If he wants to be seen here he will need to get [facility], or be self pay, and pay $100 down.  If we see him w/o [place] [facility], he will have a bill to pay out of pocket. He should have gotten d/c papers that would tell him who was on call, etc. He should be able to see the on call provider without having to pay $100 or more down, since they were on call they should see him. However they Dontre Laduca ask him to get the [place] [facility] first as well.

## 2020-06-08 NOTE — Telephone Encounter (Signed)
Patient called stating he was seen at ED on 06/06/20 for leg/knee pain.  He wants to be seen either sometime on Monday or Tuesday morning because he wants to get back to work.  He has insurance through the [place].  I need help in telling him what the next steps are.  Can you help me?

## 2020-06-10 NOTE — Telephone Encounter (Signed)
Tried to call patient back but didn't get an answer

## 2020-06-13 ENCOUNTER — Emergency Department (HOSPITAL_COMMUNITY)
Admission: EM | Admit: 2020-06-13 | Discharge: 2020-06-13 | Disposition: A | Discharge status: Home / Self Care | Payer: No Typology Code available for payment source | Attending: Emergency Medicine | Admitting: Emergency Medicine

## 2020-06-13 ENCOUNTER — Other Ambulatory Visit: Payer: Self-pay

## 2020-06-13 ENCOUNTER — Encounter (HOSPITAL_COMMUNITY): Payer: Self-pay | Admitting: *Deleted

## 2020-06-13 DIAGNOSIS — I10 Essential (primary) hypertension: Secondary | ICD-10-CM | POA: Insufficient documentation

## 2020-06-13 DIAGNOSIS — F17210 Nicotine dependence, cigarettes, uncomplicated: Secondary | ICD-10-CM | POA: Insufficient documentation

## 2020-06-13 DIAGNOSIS — Z0289 Encounter for other administrative examinations: Secondary | ICD-10-CM

## 2020-06-13 DIAGNOSIS — Z79899 Other long term (current) drug therapy: Secondary | ICD-10-CM | POA: Insufficient documentation

## 2020-06-13 DIAGNOSIS — E119 Type 2 diabetes mellitus without complications: Secondary | ICD-10-CM | POA: Insufficient documentation

## 2020-06-13 DIAGNOSIS — Z7984 Long term (current) use of oral hypoglycemic drugs: Secondary | ICD-10-CM | POA: Insufficient documentation

## 2020-06-13 DIAGNOSIS — Z0279 Encounter for issue of other medical certificate: Secondary | ICD-10-CM | POA: Insufficient documentation

## 2020-06-13 NOTE — ED Triage Notes (Signed)
Pt states he is here to get an excuse to go back to work; pt was seen here last week and recommended to follow up with an orthopedic doctor and pt states they do not have any recent appointments to be seen

## 2020-06-13 NOTE — ED Notes (Signed)
PA informed of BP, BP rechecked and lower.  Patient instructed to go home and rest and then check BP again.  Patient verbalized understanding states BP is always high when he comes to the hospital and he is frustrated and wanting to go home.

## 2020-06-13 NOTE — ED Provider Notes (Signed)
[facility] EMERGENCY DEPARTMENT Provider Note   CSN: [id number] Arrival date & time: 06/13/20  1257     History Chief Complaint  Patient presents with  . Knee Pain    Eddie Castro is a 64 y.o. male who presents for a work note. He states that he was here last week because he fell on his right knee. Xray was negative at that time. He works as a Lawyer in a nursing home and was unable to bear weight so was referred to orthopedics. He tried to follow up with Dr. Mort Sawyers office but couldn't because [facility]. He's back today because he feels much better and he needs another work note that clears him to go back. He denies any symptoms.  HPI     Past Medical History:  Diagnosis Date  . Diabetes mellitus without complication (HCC)    Borderline  . Hypertension     There are no problems to display for this patient.   History reviewed. No pertinent surgical history.     History reviewed. No pertinent family history.  Social History   Tobacco Use  . Smoking status: Current Every Day Smoker    Packs/day: 0.50    Types: Cigarettes  . Smokeless tobacco: Never Used  Vaping Use  . Vaping Use: Never used  Substance Use Topics  . Alcohol use: Yes    Alcohol/week: 2.0 standard drinks    Types: 2 Shots of liquor per week    Comment: last night.  drinks when he is off work.   . Drug use: No    Types: Cocaine, Marijuana    Comment: denies    Home Medications Prior to Admission medications   Medication Sig Start Date End Date Taking? Authorizing Provider  benazepril (LOTENSIN) 40 MG tablet Take 1 tablet (40 mg total) by mouth daily. 12/31/17   Triplett, Tammy, PA-C  diclofenac Sodium (VOLTAREN) 1 % GEL Apply 4 g topically 4 (four) times daily as needed. 06/06/20   Petrucelli, Samantha R, PA-C  gabapentin (NEURONTIN) 100 MG capsule Take 100 mg by mouth 3 (three) times daily.    [provider]  hydrochlorothiazide (HYDRODIURIL) 25 MG tablet Take 25 mg by mouth  daily.    [provider]  metFORMIN (GLUCOPHAGE) 500 MG tablet Take by mouth 2 (two) times daily with a meal.    [provider]    Allergies    Patient has no known allergies.  Review of Systems   Review of Systems  Musculoskeletal: Negative for arthralgias and myalgias.  Neurological: Negative for weakness and numbness.    Physical Exam Updated Vital Signs BP (!) 192/138 (BP Location: Right Arm)   Pulse 86   Temp 97.9 F (36.6 C) (Oral)   Resp 20   Ht 5\' 11"  (1.803 m)   Wt 79.4 kg   SpO2 98%   BMI 24.41 kg/m   Physical Exam Vitals and nursing note reviewed.  Constitutional:      General: He is not in acute distress.    Appearance: Normal appearance. He is well-developed. He is not ill-appearing.     Comments: Calm and cooperative. NAD  HENT:     Head: Normocephalic and atraumatic.  Eyes:     General: No scleral icterus.       Right eye: No discharge.        Left eye: No discharge.     Conjunctiva/sclera: Conjunctivae normal.     Pupils: Pupils are equal, round, and reactive  to light.  Cardiovascular:     Rate and Rhythm: Normal rate.  Pulmonary:     Effort: Pulmonary effort is normal. No respiratory distress.  Abdominal:     General: There is no distension.  Musculoskeletal:     Cervical back: Normal range of motion.     Comments: Right knee: Scabbed over wound on the anterior knee. No swelling or tenderness. FROM. Ambulatory without difficulty.  Skin:    General: Skin is warm and dry.  Neurological:     Mental Status: He is alert and oriented to person, place, and time.  Psychiatric:        Behavior: Behavior normal.     ED Results / Procedures / Treatments   Labs (all labs ordered are listed, but only abnormal results are displayed) Labs Reviewed - No data to display  EKG None  Radiology No results found.  Procedures Procedures (including critical care time)  Medications Ordered in ED Medications - No data to  display  ED Course  I have reviewed the triage vital signs and the nursing notes.  Pertinent labs & imaging results that were available during my care of the patient were reviewed by me and considered in my medical decision making (see chart for details).  64 year old male presents for a work note. He currently feels much better and is ambulatory without difficulty. He will not require ortho follow up at this time. Will give him another work note clearing him to go back. Of note his blood pressure is significantly elevated. Pt states he is upset that he waited a long time and he's cold. He took his BP meds this morning and when he checks his BP at home it's usually normal. He has asymptomatic HTN at this time. Encouraged him to have this rechecked.   MDM Rules/Calculators/A&P                           Final Clinical Impression(s) / ED Diagnoses Final diagnoses:  Encounter to obtain excuse from work    Rx / DC Orders ED Discharge Orders    None       Bethel Born, Cordelia Poche 06/13/20 1733    Vanetta Mulders, MD 06/17/20 [phone]

## 2021-07-25 ENCOUNTER — Other Ambulatory Visit (HOSPITAL_COMMUNITY): Payer: Self-pay | Admitting: Family Medicine

## 2021-07-25 DIAGNOSIS — M25512 Pain in left shoulder: Secondary | ICD-10-CM

## 2021-09-30 IMAGING — DX DG TIBIA/FIBULA 2V*R*
3 series · 3 of 3 positions shown · non-contrast
Comparison: None.

CLINICAL DATA: Fall today with pain to right knee with abrasion to
lower leg

EXAM:
RIGHT TIBIA AND FIBULA - 2 VIEW

[tibia ap (1 of 2)]
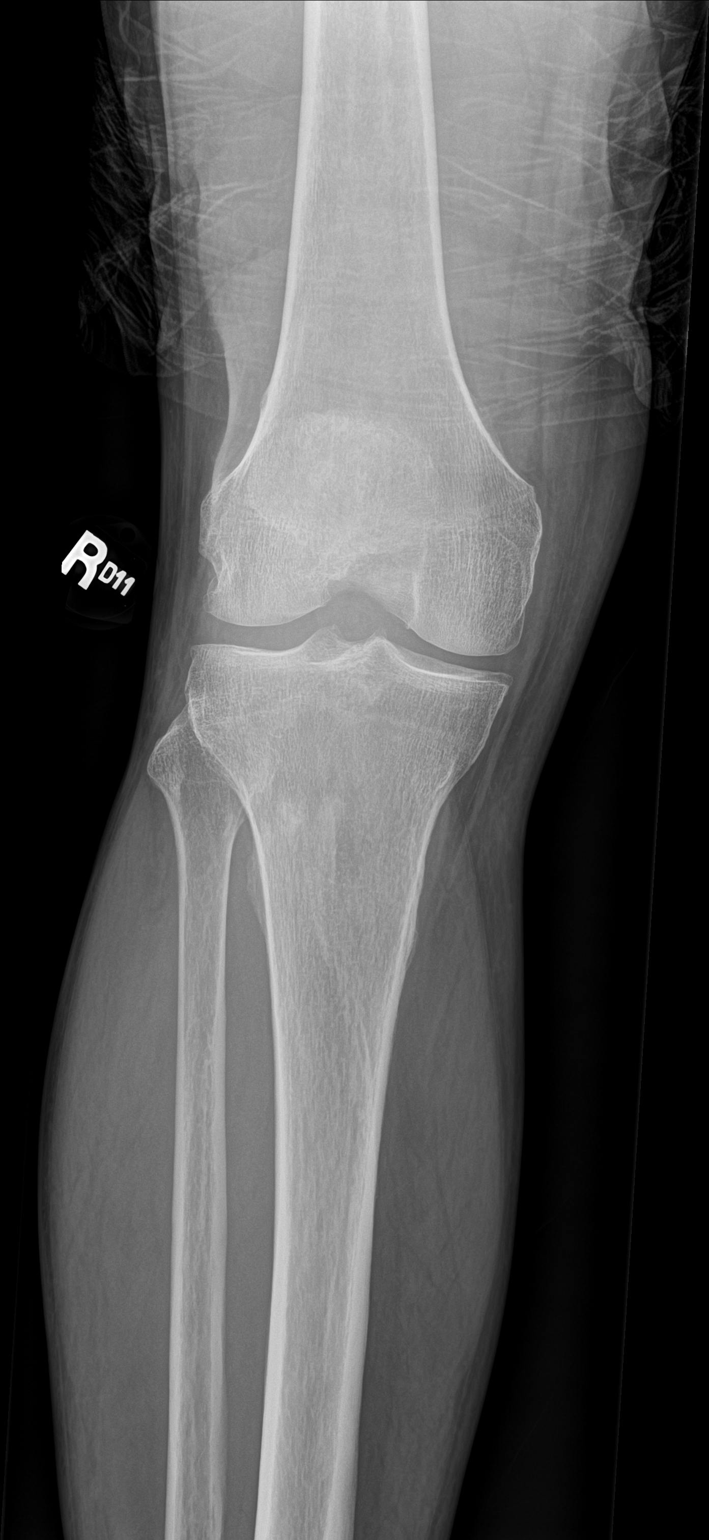

[tibia ap (2 of 2)]
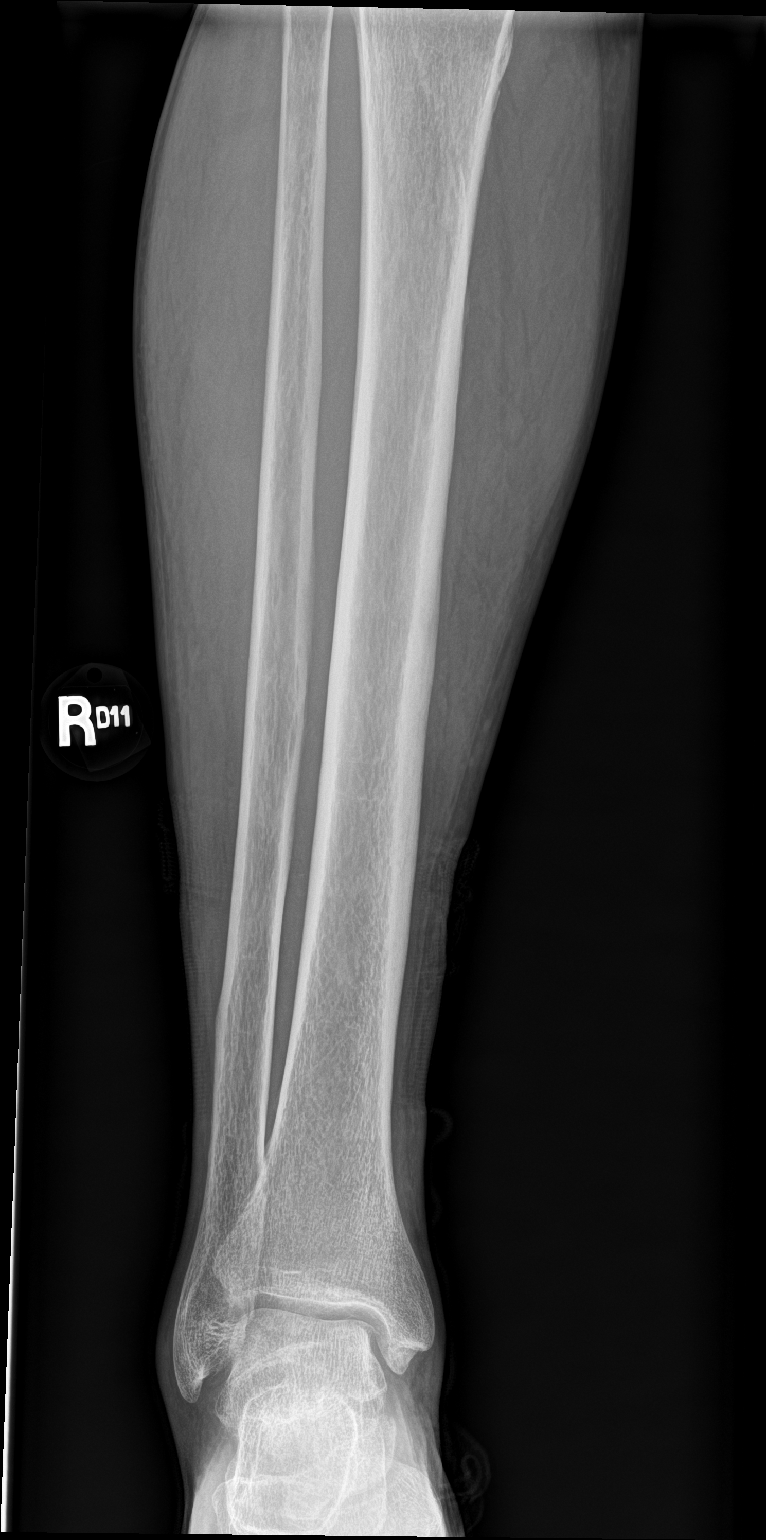

[tibia lat]
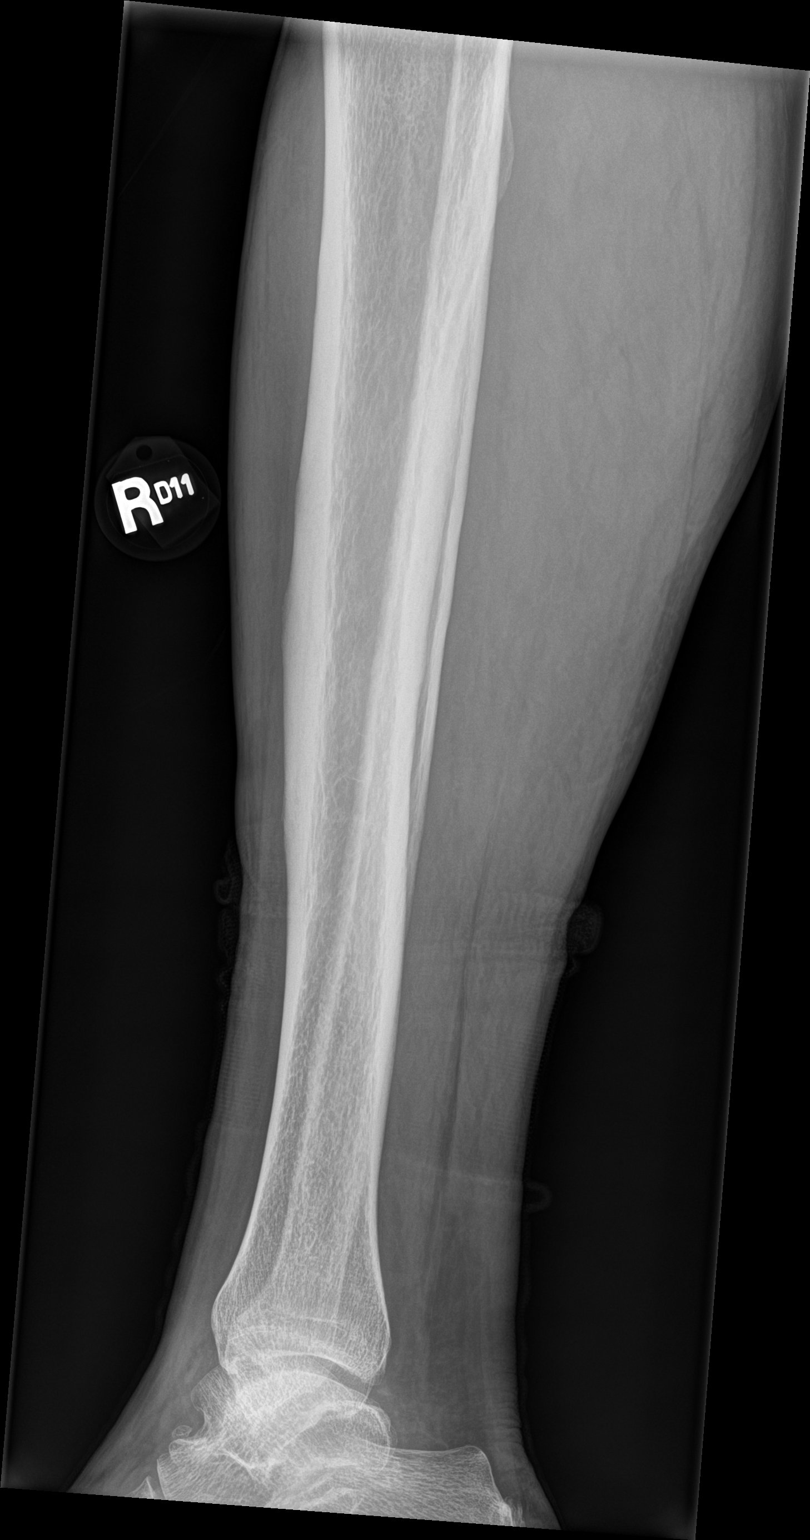

[3 of 3 positions shown; findings below may reference images not displayed]

FINDINGS: There is no evidence of fracture or other focal bone lesions. Soft
tissues are unremarkable.
IMPRESSION: Negative radiographs of the right tibia and fibula.
